# Patient Record
Sex: Female | Born: 1995 | Race: White | Hispanic: No | Marital: Single | Smoking: Current every day smoker
Health system: Southern US, Community
[De-identification: ages and names within clinical notes are randomized; demographics above are authoritative.]

## PROBLEM LIST (undated history)

## (undated) DIAGNOSIS — I10 Essential (primary) hypertension: Secondary | ICD-10-CM

## (undated) DIAGNOSIS — R569 Unspecified convulsions: Secondary | ICD-10-CM

## (undated) HISTORY — PX: WISDOM TOOTH EXTRACTION: SHX21

---
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
## 2020-11-03 ENCOUNTER — Emergency Department (HOSPITAL_COMMUNITY)
Admission: EM | Admit: 2020-11-03 | Discharge: 2020-11-03 | Disposition: A | Discharge status: Home / Self Care | Payer: Medicaid Other | Attending: Emergency Medicine | Admitting: Emergency Medicine

## 2020-11-03 ENCOUNTER — Emergency Department (HOSPITAL_COMMUNITY): Payer: Medicaid Other

## 2020-11-03 ENCOUNTER — Other Ambulatory Visit: Payer: Self-pay

## 2020-11-03 ENCOUNTER — Encounter (HOSPITAL_COMMUNITY): Payer: Self-pay

## 2020-11-03 DIAGNOSIS — R519 Headache, unspecified: Secondary | ICD-10-CM | POA: Insufficient documentation

## 2020-11-03 DIAGNOSIS — S22089D Unspecified fracture of T11-T12 vertebra, subsequent encounter for fracture with routine healing: Secondary | ICD-10-CM | POA: Insufficient documentation

## 2020-11-03 DIAGNOSIS — S29002D Unspecified injury of muscle and tendon of back wall of thorax, subsequent encounter: Secondary | ICD-10-CM | POA: Diagnosis present

## 2020-11-03 DIAGNOSIS — Z91040 Latex allergy status: Secondary | ICD-10-CM | POA: Diagnosis not present

## 2020-11-03 DIAGNOSIS — I10 Essential (primary) hypertension: Secondary | ICD-10-CM | POA: Insufficient documentation

## 2020-11-03 DIAGNOSIS — R200 Anesthesia of skin: Secondary | ICD-10-CM | POA: Insufficient documentation

## 2020-11-03 DIAGNOSIS — F17200 Nicotine dependence, unspecified, uncomplicated: Secondary | ICD-10-CM | POA: Insufficient documentation

## 2020-11-03 LAB — I-STAT BETA HCG BLOOD, ED (MC, WL, AP ONLY): I-stat hCG, quantitative: <5 m[IU]/mL (ref <5)

## 2020-11-03 MED ORDER — FENTANYL CITRATE (PF) 100 MCG/2ML IJ SOLN
50.0000 ug | Freq: Once | INTRAMUSCULAR | Status: AC
Start: 2020-11-03 — End: 2020-11-03
  Administered 2020-11-03: 15:00:00 50 ug via INTRAVENOUS
  Filled 2020-11-03: qty 2

## 2020-11-03 MED ORDER — ONDANSETRON 8 MG PO TBDP: 8.0000 mg | ORAL_TABLET | Freq: Three times a day (TID) | ORAL | 0 refills | Status: AC | PRN

## 2020-11-03 NOTE — ED Notes (Signed)
Patient transported to MRI 

## 2020-11-03 NOTE — ED Notes (Signed)
Pt returned from MRI, assisted with placing TLSO brace back on.
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
## 2020-11-03 NOTE — Discharge Instructions (Addendum)
Please make follow up appointment with orthopedic group Use zofran for nausea Return if increased weakness,numbness, or loss of bowel or bladder control
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
